# Patient Record
Sex: Female | Born: 1965 | Race: White | Hispanic: No | Marital: Married | Smoking: Never smoker
Health system: Southern US, Community
[De-identification: ages and names within clinical notes are randomized; demographics above are authoritative.]

## PROBLEM LIST (undated history)

## (undated) DIAGNOSIS — I10 Essential (primary) hypertension: Secondary | ICD-10-CM

## (undated) HISTORY — PX: OTHER SURGICAL HISTORY: SHX169

## (undated) HISTORY — DX: Essential (primary) hypertension: I10

---

## 2008-05-03 ENCOUNTER — Emergency Department: Payer: Self-pay | Admitting: Emergency Medicine

## 2008-05-09 ENCOUNTER — Ambulatory Visit: Payer: Self-pay | Admitting: Orthopedic Surgery

## 2008-05-27 ENCOUNTER — Ambulatory Visit: Payer: Self-pay | Admitting: Internal Medicine

## 2009-08-27 ENCOUNTER — Ambulatory Visit: Payer: Self-pay | Admitting: Internal Medicine

## 2009-10-12 ENCOUNTER — Ambulatory Visit: Payer: Self-pay | Admitting: Rheumatology

## 2009-11-09 ENCOUNTER — Ambulatory Visit: Payer: Self-pay | Admitting: Orthopedic Surgery

## 2009-11-13 ENCOUNTER — Ambulatory Visit: Payer: Self-pay | Admitting: Orthopedic Surgery

## 2011-03-04 ENCOUNTER — Ambulatory Visit: Payer: Self-pay

## 2013-03-05 ENCOUNTER — Emergency Department: Payer: Self-pay | Admitting: Emergency Medicine

## 2013-03-05 LAB — CBC WITH DIFFERENTIAL/PLATELET
Basophil %: 1.3 %
Eosinophil #: 0.1 10*3/uL (ref 0.0–0.7)
Eosinophil %: 1.4 %
HCT: 36.2 % (ref 35.0–47.0)
HGB: 12.1 g/dL (ref 12.0–16.0)
MCV: 81 fL (ref 80–100)
Neutrophil #: 3.1 10*3/uL (ref 1.4–6.5)
Platelet: 301 10*3/uL (ref 150–440)
RBC: 4.47 10*6/uL (ref 3.80–5.20)
RDW: 15.1 % — ABNORMAL HIGH (ref 11.5–14.5)
WBC: 5.6 10*3/uL (ref 3.6–11.0)

## 2013-03-05 LAB — URINALYSIS, COMPLETE
Glucose,UR: NEGATIVE mg/dL (ref 0–75)
Leukocyte Esterase: NEGATIVE
Nitrite: NEGATIVE
Ph: 7 (ref 4.5–8.0)
Protein: NEGATIVE
Specific Gravity: 1.014 (ref 1.003–1.030)
Squamous Epithelial: 2

## 2013-03-05 LAB — COMPREHENSIVE METABOLIC PANEL
Albumin: 3.6 g/dL (ref 3.4–5.0)
Alkaline Phosphatase: 109 U/L (ref 50–136)
BUN: 8 mg/dL (ref 7–18)
Calcium, Total: 8.5 mg/dL (ref 8.5–10.1)
Co2: 28 mmol/L (ref 21–32)
Creatinine: 0.77 mg/dL (ref 0.60–1.30)
EGFR (African American): >60
Potassium: 4 mmol/L (ref 3.5–5.1)
SGOT(AST): 16 U/L (ref 15–37)
Sodium: 142 mmol/L (ref 136–145)

## 2017-02-09 ENCOUNTER — Emergency Department: Payer: Self-pay

## 2017-02-09 ENCOUNTER — Emergency Department
Admission: EM | Admit: 2017-02-09 | Discharge: 2017-02-10 | Disposition: A | Discharge status: Home / Self Care | Payer: Self-pay | Attending: Emergency Medicine | Admitting: Emergency Medicine

## 2017-02-09 ENCOUNTER — Encounter: Payer: Self-pay | Admitting: *Deleted

## 2017-02-09 DIAGNOSIS — L03116 Cellulitis of left lower limb: Secondary | ICD-10-CM | POA: Insufficient documentation

## 2017-02-09 MED ORDER — SULFAMETHOXAZOLE-TRIMETHOPRIM 800-160 MG PO TABS: 1.0000 | ORAL_TABLET | Freq: Two times a day (BID) | ORAL | 0 refills | Status: AC

## 2017-02-09 MED ORDER — SULFAMETHOXAZOLE-TRIMETHOPRIM 800-160 MG PO TABS
2.0000 | ORAL_TABLET | Freq: Once | ORAL | Status: DC
  Filled 2017-02-09: qty 2

## 2017-02-09 NOTE — ED Provider Notes (Signed)
[facility] Emergency Department Provider Note   ____________________________________________   First MD Initiated Contact with Patient 02/09/17 2306     (approximate)  I have reviewed the triage vital signs and the nursing notes.   HISTORY  Chief Complaint Leg Swelling    HPI Annette Mcclure is a 51 y.o. female who comes into the hospital today with some left foot swelling. She reports that she traveled from [place] on Sunday. She had swelling in both of her feet but Monday morning when she woke up the swelling was better. She worked on her feet all day and the swelling returned. Yesterday the patient went to urgent care and was given Bactrim as she does have a blister behind her left ankle. The patient reports that the person she saw told her they were concerned about her having a blood clot. The patient did not receive an ultrasound but was told to take aspirin twice a day. She went back to work today. This morning her ankle again was only mildly swollen but it was worse after standing on it all day. The patient reports that standing on her foot her pain is 8 out of 10 in intensity but when she is laying she has no pain. The patient is taking one pill of Bactrim twice a day but her ankle is hot and red. She can barely move it because it is so swollen.   History reviewed. No pertinent past medical history.  There are no active problems to display for this patient.   Past Surgical History:  Procedure Laterality Date  . knee surgery      Prior to Admission medications   Medication Sig Start Date End Date Taking? Authorizing Provider  sulfamethoxazole-trimethoprim (BACTRIM DS,SEPTRA DS) 800-160 MG tablet Take 1 tablet by mouth 2 (two) times daily. 02/09/17 02/19/17  Rebecka Apley, MD    Allergies Patient has no known allergies.  No family history on file.  Social History Social History  Substance Use Topics  . Smoking status: Never Smoker  .  Smokeless tobacco: Never Used  . Alcohol use No    Review of Systems  Constitutional: No fever/chills Eyes: No visual changes. ENT: No sore throat. Cardiovascular: Denies chest pain. Respiratory: Denies shortness of breath. Gastrointestinal: No abdominal pain.  No nausea, no vomiting.  No diarrhea.  No constipation. Genitourinary: Negative for dysuria. Musculoskeletal: Swelling to left ankle Skin: Redness and warmth to left ankle, abrasion to posterior ankle Neurological: Negative for headaches, focal weakness or numbness.   ____________________________________________   PHYSICAL EXAM:  VITAL SIGNS: ED Triage Vitals  Enc Vitals Group     BP 02/09/17 2211 128/69     Pulse Rate 02/09/17 2211 95     Resp 02/09/17 2211 20     Temp 02/09/17 2211 98 F (36.7 C)     Temp Source 02/09/17 2211 Oral     SpO2 02/09/17 2211 99 %     Weight 02/09/17 2211 210 lb (95.3 kg)     Height 02/09/17 2211 5\' 11"  (1.803 m)     Head Circumference --      Peak Flow --      Pain Score 02/09/17 2210 8     Pain Loc --      Pain Edu? --      Excl. in GC? --     Constitutional: Alert and oriented. Well appearing and in Mild distress. Eyes: Conjunctivae are normal. PERRL. EOMI. Head: Atraumatic. Nose: No congestion/rhinnorhea. Mouth/Throat:  Mucous membranes are moist.  Oropharynx non-erythematous. Cardiovascular: Normal rate, regular rhythm. Grossly normal heart sounds.  Good peripheral circulation. Respiratory: Normal respiratory effort.  No retractions. Lungs CTAB. Gastrointestinal: Soft and nontender. No distention. Positive bowel sounds Musculoskeletal: Soft tissue swelling to left ankle with some pain with passive range of motion.  Neurologic:  Normal speech and language. Skin:  Erythema to left foot and ankle with abrasion to posterior ankle Psychiatric: Mood and affect are normal.   ____________________________________________   LABS (all labs ordered are listed, but only abnormal  results are displayed)  Labs Reviewed - No data to display ____________________________________________  EKG  none ____________________________________________  RADIOLOGY  [place] Venous Img Lower Unilateral Left  Result Date: 02/09/2017 CLINICAL DATA:  Initial evaluation for acute leg swelling. EXAM: Left LOWER EXTREMITY VENOUS DOPPLER ULTRASOUND TECHNIQUE: Gray-scale sonography with graded compression, as well as color Doppler and duplex ultrasound were performed to evaluate the lower extremity deep venous systems from the level of the common femoral vein and including the common femoral, femoral, profunda femoral, popliteal and calf veins including the posterior tibial, peroneal and gastrocnemius veins when visible. The superficial great saphenous vein was also interrogated. Spectral Doppler was utilized to evaluate flow at rest and with distal augmentation maneuvers in the common femoral, femoral and popliteal veins. COMPARISON:  None. FINDINGS: Contralateral Common Femoral Vein: Respiratory phasicity is normal and symmetric with the symptomatic side. No evidence of thrombus. Normal compressibility. Common Femoral Vein: No evidence of thrombus. Normal compressibility, respiratory phasicity and response to augmentation. Saphenofemoral Junction: No evidence of thrombus. Normal compressibility and flow on color Doppler imaging. Profunda Femoral Vein: No evidence of thrombus. Normal compressibility and flow on color Doppler imaging. Femoral Vein: No evidence of thrombus. Normal compressibility, respiratory phasicity and response to augmentation. Popliteal Vein: No evidence of thrombus. Normal compressibility, respiratory phasicity and response to augmentation. Calf Veins: No evidence of thrombus. Normal compressibility and flow on color Doppler imaging. Superficial Great Saphenous Vein: No evidence of thrombus. Normal compressibility and flow on color Doppler imaging. Venous Reflux:  None. Other Findings:   None. IMPRESSION: No evidence of DVT within the left lower extremity. Electronically Signed   By: Rise MuBenjamin  McClintock M.D.   On: 02/09/2017 23:02    ____________________________________________   PROCEDURES  Procedure(s) performed: None  Procedures  Critical Care performed: No  ____________________________________________   INITIAL IMPRESSION / ASSESSMENT AND PLAN / ED COURSE  Pertinent labs & imaging results that were available during my care of the patient were reviewed by me and considered in my medical decision making (see chart for details).  This is a 51 year old female who comes into the hospital today with some redness swelling and warmth to her left ankle. The patient did receive an ultrasound but it is negative for DVT. It appears that the patient does have cellulitis. She is taking Bactrim but she is being underdosed. I will give the patient to double strength Bactrim and I will discharge her with a prescription to double her dose of Bactrim at home. The patient should follow-up with her primary care physician.  Clinical Course as of Feb 10 2328  Thu Feb 09, 2017  2305 No evidence of DVT within the left lower extremity. US Venous Img Lower Unilateral Left [AW]    Clinical Course User Index [AW] Rebecka ApleyWebster, Allison P, MD     ____________________________________________   FINAL CLINICAL IMPRESSION(S) / ED DIAGNOSES  Final diagnoses:  Cellulitis of left lower extremity      NEW  MEDICATIONS STARTED DURING THIS VISIT:  New Prescriptions   SULFAMETHOXAZOLE-TRIMETHOPRIM (BACTRIM DS,SEPTRA DS) 800-160 MG TABLET    Take 1 tablet by mouth 2 (two) times daily.     Note:  This document was prepared using Dragon voice recognition software and may include unintentional dictation errors.    Rebecka Apley, MD 02/09/17 2329

## 2017-02-09 NOTE — Discharge Instructions (Signed)
Please take your Bactrim 2 pills twice a day for 10 days. Your prescription is written to double up on your previous dose. Please follow-up with your primary care physician. Please return if he is still having some redness and swelling and pain within the next 3-4 days. Please return with any fevers or vomiting.

## 2017-02-09 NOTE — ED Triage Notes (Signed)
Pt has swelling to left lower leg.  Pt had a blister on back of left foot and now foot is swollen and red.  Pt recently drove 11 hours.  Pt reports pain with ambulating.

## 2017-02-10 MED ORDER — SULFAMETHOXAZOLE-TRIMETHOPRIM 800-160 MG PO TABS
1.0000 | ORAL_TABLET | Freq: Once | ORAL | Status: AC
  Administered 2017-02-10: 1 via ORAL

## 2017-02-10 NOTE — ED Notes (Signed)

## 2017-10-12 ENCOUNTER — Other Ambulatory Visit: Payer: Self-pay

## 2017-10-12 ENCOUNTER — Emergency Department
Admission: EM | Admit: 2017-10-12 | Discharge: 2017-10-12 | Disposition: A | Discharge status: Home / Self Care | Attending: Emergency Medicine | Admitting: Emergency Medicine

## 2017-10-12 ENCOUNTER — Emergency Department

## 2017-10-12 ENCOUNTER — Encounter: Payer: Self-pay | Admitting: Emergency Medicine

## 2017-10-12 DIAGNOSIS — E860 Dehydration: Secondary | ICD-10-CM | POA: Insufficient documentation

## 2017-10-12 DIAGNOSIS — Z3202 Encounter for pregnancy test, result negative: Secondary | ICD-10-CM | POA: Insufficient documentation

## 2017-10-12 DIAGNOSIS — R531 Weakness: Secondary | ICD-10-CM | POA: Diagnosis not present

## 2017-10-12 DIAGNOSIS — R55 Syncope and collapse: Secondary | ICD-10-CM | POA: Diagnosis present

## 2017-10-12 DIAGNOSIS — R0602 Shortness of breath: Secondary | ICD-10-CM | POA: Diagnosis not present

## 2017-10-12 LAB — BASIC METABOLIC PANEL
ANION GAP: 9 (ref 5–15)
BUN: 12 mg/dL (ref 6–20)
CO2: 25 mmol/L (ref 22–32)
Calcium: 8.8 mg/dL — ABNORMAL LOW (ref 8.9–10.3)
Chloride: 103 mmol/L (ref 101–111)
Creatinine, Ser: 0.83 mg/dL (ref 0.44–1.00)
GFR calc non Af Amer: >60 mL/min (ref >60)
GLUCOSE: 105 mg/dL — AB (ref 65–99)
POTASSIUM: 3.6 mmol/L (ref 3.5–5.1)
Sodium: 137 mmol/L (ref 135–145)

## 2017-10-12 LAB — CBC
HEMATOCRIT: 29.3 % — AB (ref 35.0–47.0)
HEMOGLOBIN: 8.9 g/dL — AB (ref 12.0–16.0)
MCH: 20 pg — AB (ref 26.0–34.0)
MCHC: 30.4 g/dL — AB (ref 32.0–36.0)
MCV: 65.9 fL — ABNORMAL LOW (ref 80.0–100.0)
Platelets: 410 10*3/uL (ref 150–440)
RBC: 4.45 MIL/uL (ref 3.80–5.20)
RDW: 18.8 % — ABNORMAL HIGH (ref 11.5–14.5)
WBC: 5.4 10*3/uL (ref 3.6–11.0)

## 2017-10-12 LAB — URINALYSIS, COMPLETE (UACMP) WITH MICROSCOPIC
Bilirubin Urine: NEGATIVE
GLUCOSE, UA: NEGATIVE mg/dL
KETONES UR: NEGATIVE mg/dL
Leukocytes, UA: NEGATIVE
Nitrite: NEGATIVE
PROTEIN: NEGATIVE mg/dL
Specific Gravity, Urine: 1.025 (ref 1.005–1.030)
pH: 7 (ref 5.0–8.0)

## 2017-10-12 LAB — POCT PREGNANCY, URINE: PREG TEST UR: NEGATIVE

## 2017-10-12 LAB — TROPONIN I: Troponin I: <0.03 ng/mL (ref <0.03)

## 2017-10-12 LAB — FIBRIN DERIVATIVES D-DIMER (ARMC ONLY): FIBRIN DERIVATIVES D-DIMER (ARMC): 542.39 ng{FEU}/mL — AB (ref 0.00–499.00)

## 2017-10-12 MED ORDER — SODIUM CHLORIDE 0.9 % IV BOLUS (SEPSIS)
1000.0000 mL | Freq: Once | INTRAVENOUS | Status: AC
  Administered 2017-10-12: 1000 mL via INTRAVENOUS

## 2017-10-12 MED ORDER — IOPAMIDOL (ISOVUE-370) INJECTION 76%
75.0000 mL | Freq: Once | INTRAVENOUS | Status: AC | PRN
  Administered 2017-10-12: 75 mL via INTRAVENOUS
  Filled 2017-10-12: qty 75

## 2017-10-12 NOTE — ED Provider Notes (Addendum)
[facility] Emergency Department Provider Note  ____________________________________________   I have reviewed the triage vital signs and the nursing notes. Where available I have reviewed prior notes and, if possible and indicated, outside hospital notes.    HISTORY  Chief Complaint Near Syncope    HPI Annette Mcclure is a 52 y.o. 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  She is no longer on blood thinners.  She has been in her normal state of health until earlier this week when she had a vomiting illness.  No hematemesis no melena no bright red blood per rectum.  She threw up several times and has been feeling a little bit depleted since then.  Positive sick contacts with similar positive body aches etc.  No fevers.  She is now able to tolerate p.o. with no difficulty but she still feels somewhat dehydrated and today, she felt that her heart was racing and she felt lightheaded.  She did not pass out she denies of chest pain.  She states she has had a few episodes of sensation of her heart going rapidly.  Especially when she stands up.  She denies any recent travel, pain or leg swelling and she feels that she does not have any ongoing blood clot symptoms. At this time she has no symptoms at all. History reviewed. No pertinent past medical history.  There are no active problems to display for this patient.   Past Surgical History:  Procedure Laterality Date  . knee surgery      Prior to Admission medications   Not on File    Allergies Patient has no known allergies.  History reviewed. No pertinent family history.  Social History Social History   Tobacco Use  . Smoking status: Never Smoker  . Smokeless tobacco: Never Used  Substance Use Topics  . Alcohol use: No  . Drug use: No    Review of  Systems Constitutional: No fever/chills Eyes: No visual changes. ENT: No sore throat. No stiff neck no neck pain Cardiovascular: Denies chest pain. Respiratory: Denies shortness of breath. Gastrointestinal:   no vomiting.  No diarrhea.  No constipation. Genitourinary: Negative for dysuria. Musculoskeletal: Negative lower extremity swelling Skin: Negative for rash. Neurological: Negative for severe headaches, focal weakness or numbness.   ____________________________________________   PHYSICAL EXAM:  VITAL SIGNS: ED Triage Vitals  Enc Vitals Group     BP 10/12/17 1434 (!) 153/77     Pulse Rate 10/12/17 1434 83     Resp 10/12/17 1434 18     Temp 10/12/17 1434 98.3 F (36.8 C)     Temp Source 10/12/17 1434 Oral     SpO2 10/12/17 1434 99 %     Weight 10/12/17 1435 209 lb (94.8 kg)     Height 10/12/17 1435 5\' 11"  (1.803 m)     Head Circumference --      Peak Flow --      Pain Score --      Pain Loc --      Pain Edu? --      Excl. in GC? --     Constitutional: Alert and oriented. Well appearing and in no acute distress.  Patient is anxious Eyes: Conjunctivae are normal Head: Atraumatic HEENT: No congestion/rhinnorhea. Mucous membranes are dry.  Oropharynx non-erythematous Neck:   Nontender with no meningismus, no masses, no  stridor Cardiovascular: Normal rate, regular rhythm. Grossly normal heart sounds.  Good peripheral circulation. Respiratory: Normal respiratory effort.  No retractions. Lungs CTAB. Abdominal: Soft and nontender. No distention. No guarding no rebound Back:  There is no focal tenderness or step off.  there is no midline tenderness there are no lesions noted. there is no CVA tenderness Musculoskeletal: No lower extremity tenderness, no upper extremity tenderness. No joint effusions, no DVT signs strong distal pulses no edema Neurologic:  Normal speech and language. No gross focal neurologic deficits are appreciated.  Skin:  Skin is warm, dry and intact. No  rash noted. Psychiatric: Mood and affect are anxious. Speech and behavior are normal.  ____________________________________________   LABS (all labs ordered are listed, but only abnormal results are displayed)  Labs Reviewed  BASIC METABOLIC PANEL - Abnormal; Notable for the following components:      Result Value   Glucose, Bld 105 (*)    Calcium 8.8 (*)    All other components within normal limits  CBC - Abnormal; Notable for the following components:   Hemoglobin 8.9 (*)    HCT 29.3 (*)    MCV 65.9 (*)    MCH 20.0 (*)    MCHC 30.4 (*)    RDW 18.8 (*)    All other components within normal limits  URINALYSIS, COMPLETE (UACMP) WITH MICROSCOPIC  FIBRIN DERIVATIVES D-DIMER ([facility] ONLY)  TROPONIN I  CBG MONITORING, ED  POC URINE PREG, ED    Pertinent labs  results that were available during my care of the patient were reviewed by me and considered in my medical decision making (see chart for details). ____________________________________________  EKG  I personally interpreted any EKGs ordered by me or triage Sinus rhythm rate 86 bpm no acute ST elevation no acute ST depression normal axis no acute ischemic changes unremarkable EKG ____________________________________________  RADIOLOGY  Pertinent labs & imaging results that were available during my care of the patient were reviewed by me and considered in my medical decision making (see chart for details). If possible, patient and/or family made aware of any abnormal findings.  No results found. ____________________________________________    PROCEDURES  Procedure(s) performed: None  Procedures  Critical Care performed: None  ____________________________________________   INITIAL IMPRESSION / ASSESSMENT AND PLAN / ED COURSE  Pertinent labs & imaging results that were available during my care of the patient were reviewed by me and considered in my medical decision making (see chart for details).  She is here  with palpitations and lightheadedness, unfortunately she did recently have a superficial thrombosis of the left saphenous vein.  Given that sometimes PEs can present in a typical manner such as this will obtain a CT scan of her chest as a precaution.  However, most of her symptoms are quite reassuring.  She did have a episode that her heart was pounding was in the room but her heart rate stayed in the 80s and normal sinus in appearance.  No evidence therefore of dysrhythmia.  We will give her IV fluid, and we will monitor her closely here in the emergency department.  I am reassured by her findings thus far and I hope that we can get her safely home after further evaluation  ----------------------------------------- 9:22 PM on 10/12/2017 -----------------------------------------  Here with a sensation of lightheadedness is been coming and going, on a monitor we see no ectopy, and serial enzymes are negative CT of the chest is normal blood work is normal she has trace hematuria in  her urine but she states she has had some vaginal spotting, she has no abdominal pain her exam is completely normal head to toe, at this time.  She is anemic but this is chronic for her and she is exactly at her baseline.  Certainly could be more symptomatically there anemia after her vomiting illness earlier this week however at this time there is no evidence of acute bleeding of any significance.  Patient and I had a long discussion about her findings she is very reassured she feels 100% better after IV fluid and she would like to go home.  Return precautions and follow-up given and understood.  She will see her doctor tomorrow.  I have advised that if she has recurrent symptoms she is to return to the emergency room and she should follow closely with cardiology for Holter monitor and primary care.    ____________________________________________   FINAL CLINICAL IMPRESSION(S) / ED DIAGNOSES  Final diagnoses:  None       This chart was dictated using voice recognition software.  Despite best efforts to proofread,  errors can occur which can change meaning.      Jeanmarie PlantMcShane, Theresa Dohrman A, MD 10/12/17 1914    Jeanmarie PlantMcShane, Omarius Grantham A, MD 10/12/17 2123

## 2017-10-12 NOTE — ED Triage Notes (Signed)
Pt reports that she feels that is going to pass out. She report that she is suppose to get iron infusion tomorrow. She also reports she had diarrhea early this week also.

## 2017-10-12 NOTE — ED Notes (Signed)
Pt returned from CT °

## 2017-10-12 NOTE — Discharge Instructions (Signed)
We are very reassured by her workup here, if you have any new or worrisome symptoms including chest pain shortness of breath you feel like you are going to pass out, vomiting, bleeding, or anything else of concern please return to the emergency room.  Please follow closely with primary care and referral cardiology for possible Holter monitor.  Drink plenty of fluids, continue with the iron infusions as your doctor recommends and return if you feel worse in any significant way

## 2019-11-01 IMAGING — CT CT ANGIO CHEST
2 of 6 series · 18 of 46 positions shown · IV contrast (APPLIED)
Comparison: None.

CLINICAL DATA: Shortness of breath and near syncope.

EXAM:
CT ANGIOGRAPHY CHEST WITH CONTRAST
TECHNIQUE: Multidetector CT imaging of the chest was performed using the
standard protocol during bolus administration of intravenous
contrast. Multiplanar CT image reconstructions and MIPs were
obtained to evaluate the vascular anatomy.
CONTRAST:  75mL 6ZPS8Y-EF1 IOPAMIDOL (6ZPS8Y-EF1) INJECTION 76%

[Series 5: thins · axial · 0.66mm/px · z∈[-340,-97]mm · 15 of 267 slices shown]
[im 12/267  lung]
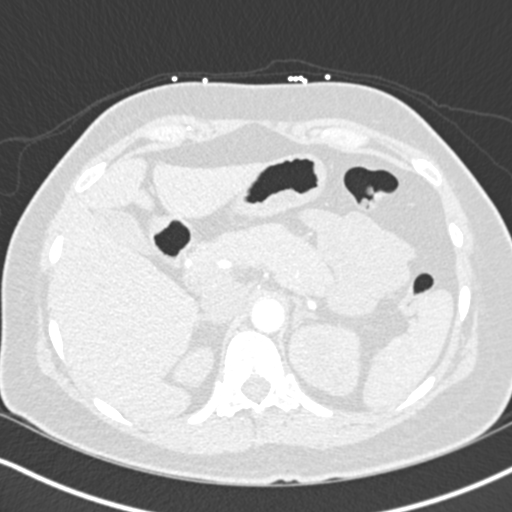
[im 35/267  soft-tissue]
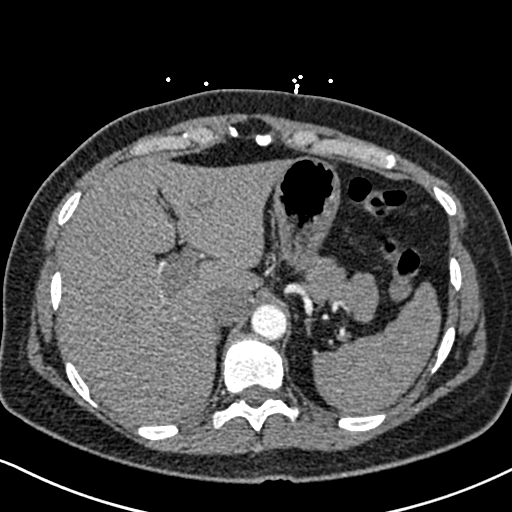
[im 47/267  lung]
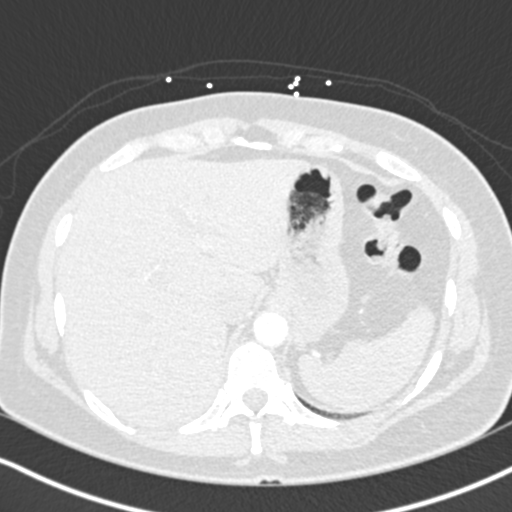
[im 70/267  soft-tissue]
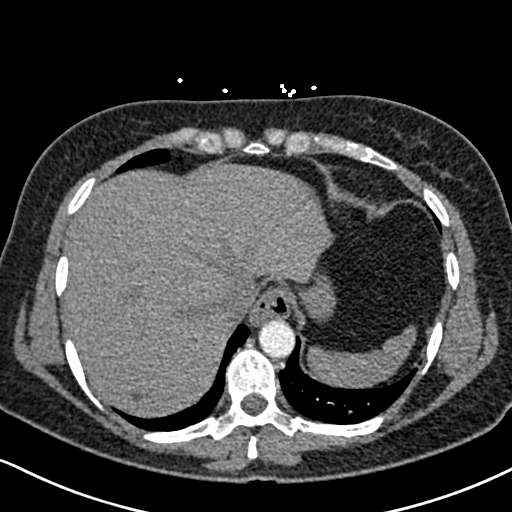
[im 81/267  lung]
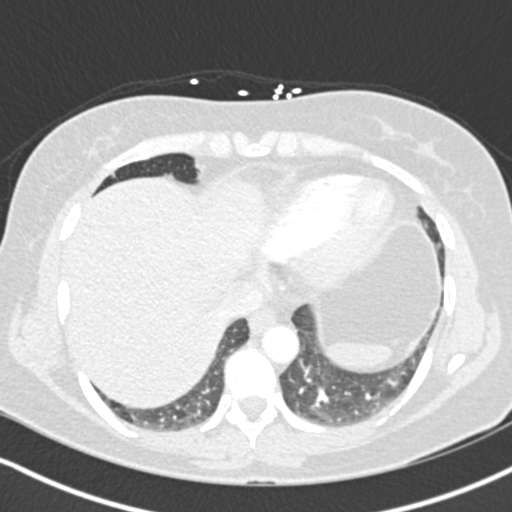
[im 105/267  soft-tissue]
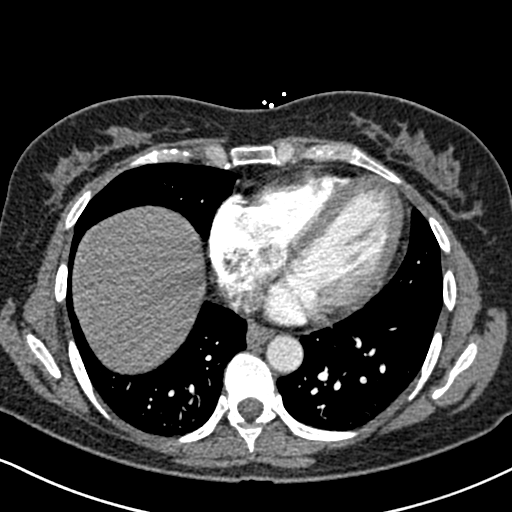
[im 116/267  lung]
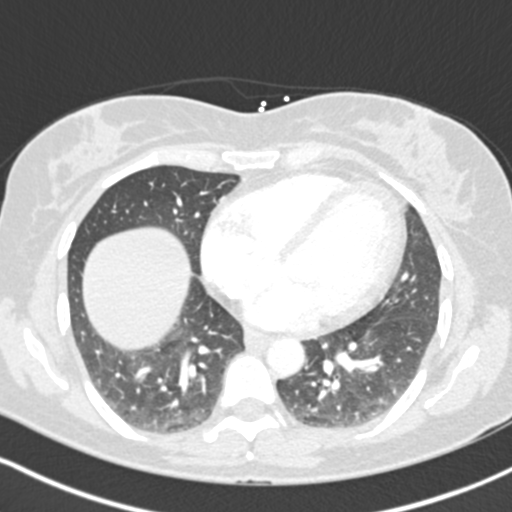
[im 139/267  soft-tissue]
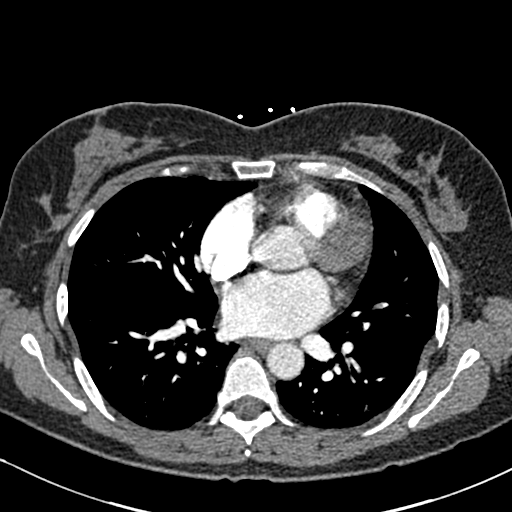
[im 151/267  lung]
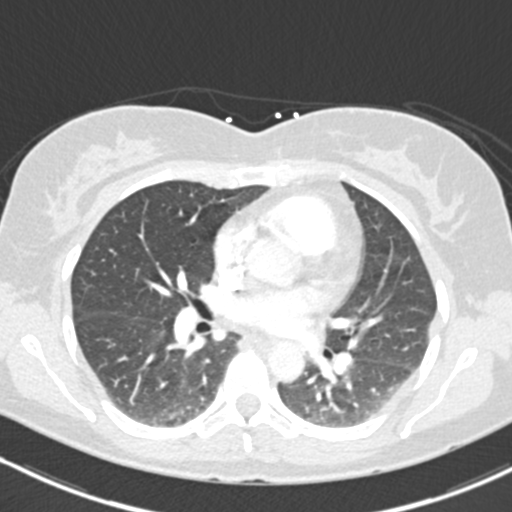
[im 162/267  soft-tissue]
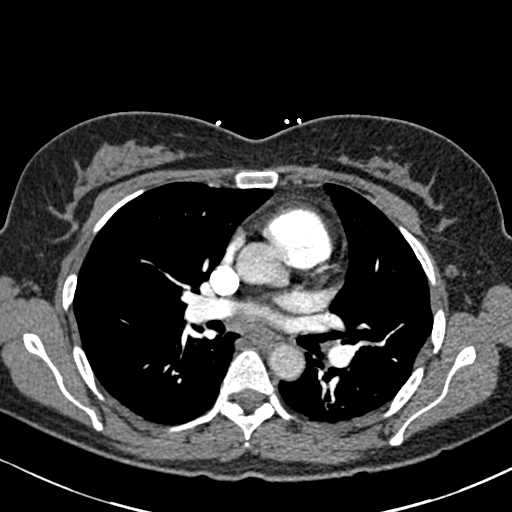
[im 186/267  lung]
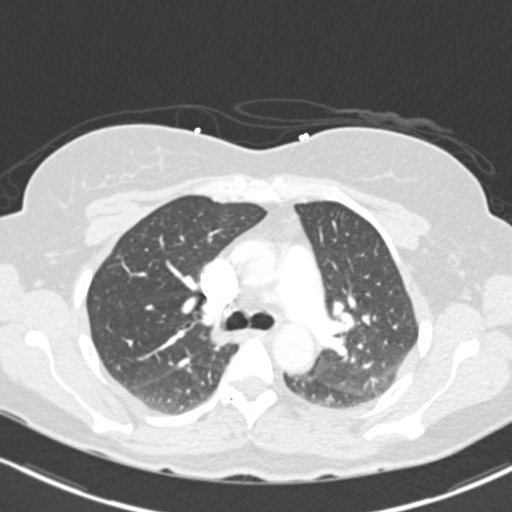
[im 197/267  soft-tissue]
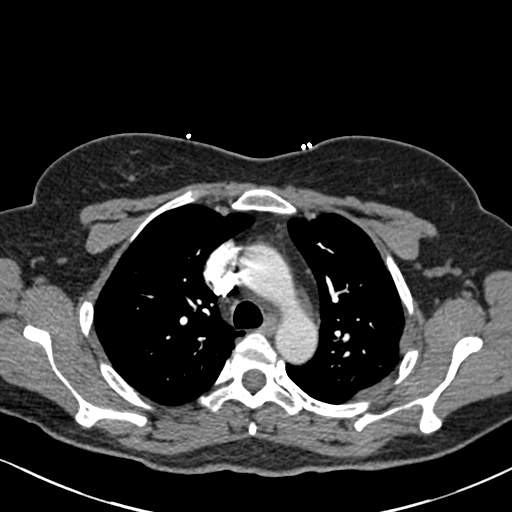
[im 220/267  lung]
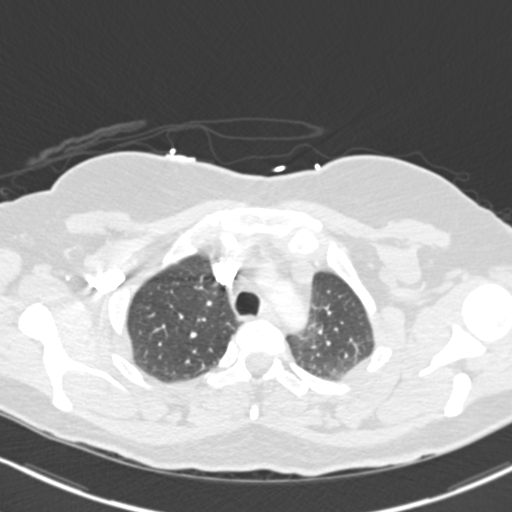
[im 232/267  soft-tissue]
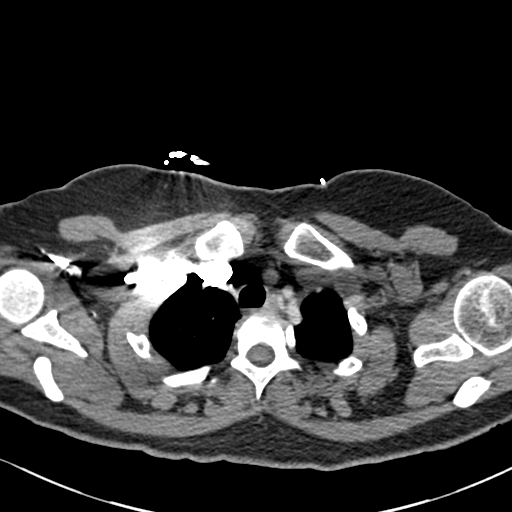
[im 255/267  lung]
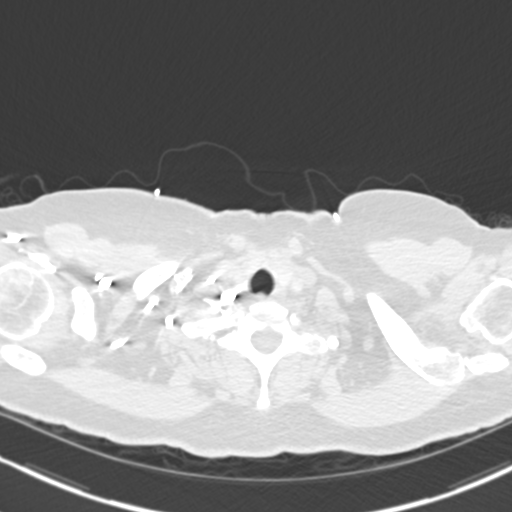

[Series 7: coronal mpr · coronal · 0.58mm/px · 3 of 83 slices shown]
[im 21/83  soft-tissue]
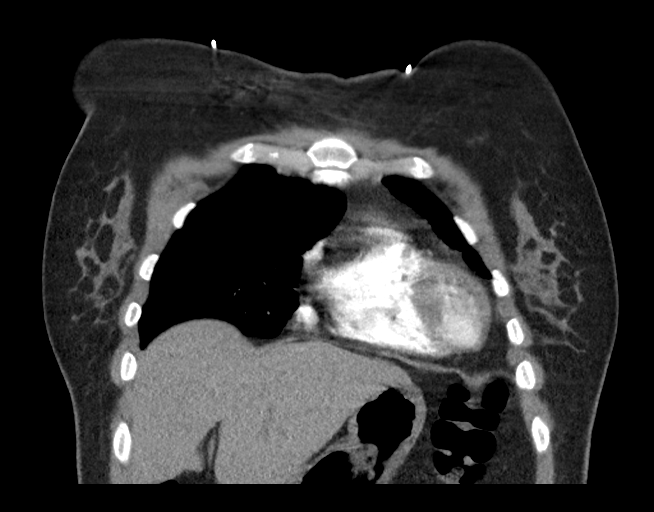
[im 42/83  soft-tissue]
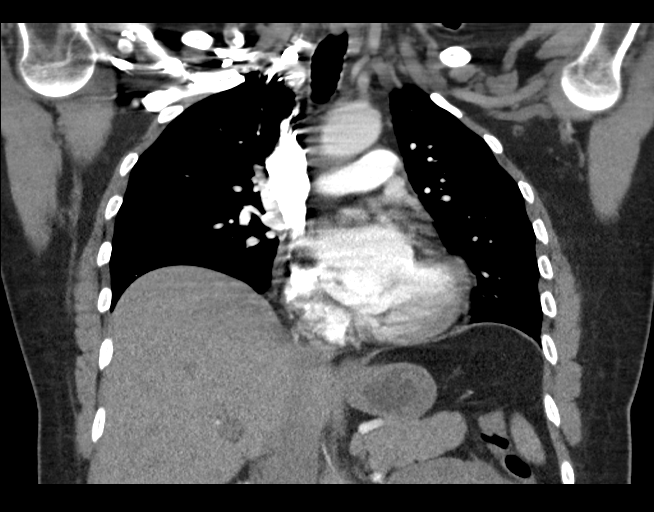
[im 62/83  soft-tissue]
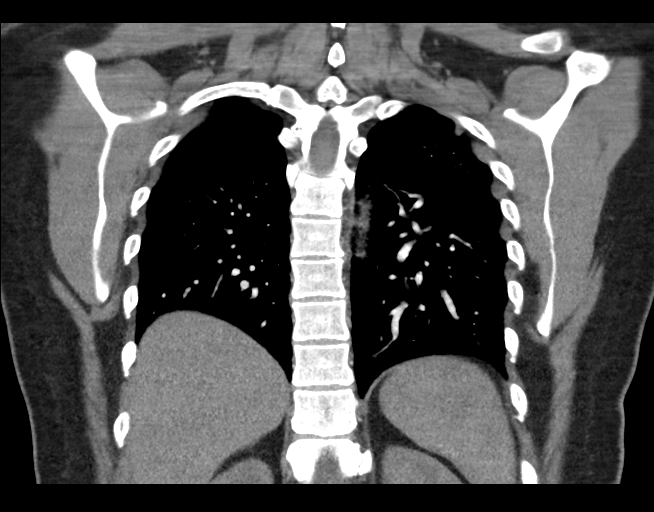

[18 of 46 positions shown; findings below may reference images not displayed]

FINDINGS: Cardiovascular: The heart is normal in size. No pericardial
effusion. The aorta is normal in caliber. No dissection. No definite
coronary artery calcifications.

The pulmonary arterial tree is fairly well opacified. No filling
defects to suggest pulmonary embolism.

Mediastinum/Nodes: Scattered small mediastinal and hilar lymph nodes
but no mass or adenopathy. The esophagus is grossly normal. Small
hiatal hernia.

Lungs/Pleura: No acute pulmonary findings. Dependent subpleural
atelectasis. No pleural effusion. No worrisome pulmonary lesions.

Upper Abdomen: Tiny low-attenuation lesion in the right hepatic lobe
posteriorly on image number 67 is most likely a benign cyst. No
worrisome hepatic lesions. The gallbladder is grossly normal. No
adrenal gland lesions. The visualized pancreas is unremarkable. The
upper abdominal aorta is unremarkable.

Musculoskeletal: No breast masses, supraclavicular or axillary
lymphadenopathy. Small scattered lymph nodes are noted. The thyroid
gland appears normal.

The bony structures are unremarkable. No acute bony findings or
worrisome bone lesion.

Review of the MIP images confirms the above findings.
IMPRESSION: 1. No CT findings for pulmonary embolism.
2. Normal thoracic aorta.
3. No acute pulmonary findings.
4. Small hiatal hernia.
5. No significant upper abdominal findings.

## 2023-05-15 ENCOUNTER — Emergency Department
Admission: EM | Admit: 2023-05-15 | Discharge: 2023-05-15 | Disposition: A | Discharge status: Home / Self Care | Attending: Emergency Medicine | Admitting: Emergency Medicine

## 2023-05-15 ENCOUNTER — Other Ambulatory Visit: Payer: Self-pay

## 2023-05-15 DIAGNOSIS — R55 Syncope and collapse: Secondary | ICD-10-CM | POA: Diagnosis present

## 2023-05-15 LAB — URINALYSIS, ROUTINE W REFLEX MICROSCOPIC
Bilirubin Urine: NEGATIVE
Glucose, UA: NEGATIVE mg/dL
Ketones, ur: NEGATIVE mg/dL
Leukocytes,Ua: NEGATIVE
Nitrite: NEGATIVE
Protein, ur: NEGATIVE mg/dL
Specific Gravity, Urine: 1.006 (ref 1.005–1.030)
pH: 7 (ref 5.0–8.0)

## 2023-05-15 LAB — CBC
HCT: 40.9 % (ref 36.0–46.0)
Hemoglobin: 14 g/dL (ref 12.0–15.0)
MCH: 30.6 pg (ref 26.0–34.0)
MCHC: 34.2 g/dL (ref 30.0–36.0)
MCV: 89.3 fL (ref 80.0–100.0)
Platelets: 241 10*3/uL (ref 150–400)
RBC: 4.58 MIL/uL (ref 3.87–5.11)
RDW: 12.5 % (ref 11.5–15.5)
WBC: 6.6 10*3/uL (ref 4.0–10.5)
nRBC: 0 % (ref 0.0–0.2)

## 2023-05-15 LAB — BASIC METABOLIC PANEL
Anion gap: 8 (ref 5–15)
BUN: 12 mg/dL (ref 6–20)
CO2: 24 mmol/L (ref 22–32)
Calcium: 9.2 mg/dL (ref 8.9–10.3)
Chloride: 106 mmol/L (ref 98–111)
Creatinine, Ser: 0.78 mg/dL (ref 0.44–1.00)
GFR, Estimated: >60 mL/min (ref >60)
Glucose, Bld: 114 mg/dL — ABNORMAL HIGH (ref 70–99)
Potassium: 3.4 mmol/L — ABNORMAL LOW (ref 3.5–5.1)
Sodium: 138 mmol/L (ref 135–145)

## 2023-05-15 MED ORDER — ONDANSETRON 4 MG PO TBDP
4.0000 mg | ORAL_TABLET | Freq: Once | ORAL | Status: AC
  Administered 2023-05-15: 4 mg via ORAL
  Filled 2023-05-15: qty 1

## 2023-05-15 NOTE — ED Triage Notes (Signed)
Pt presents to the ED via [facility] from home for near syncopal episode. Pt states that when this happened she started having palpitations. Pt reports hx of these episodes. Denies CP or SHOB. Denies palpitations at this time. Pt A&Ox4 at time of triage.

## 2023-05-15 NOTE — ED Triage Notes (Signed)
First Nurse Note: Patient to ED via [facility] from home for dizziness. HR 90 and BP 160/100.

## 2023-05-15 NOTE — ED Provider Notes (Signed)
[facility] Provider Note    Event Date/Time   First MD Initiated Contact with Patient 05/15/23 1137     (approximate)   History   Near Syncope   HPI  Annette Mcclure is a 57 y.o. female who present during near syncopal episode.  Patient reports she had a flushed sensation while sitting on the couch and felt like she was going to pass out.  She is feeling improved now.  She reports this has been occurring over the last several months intermittently.  She reports she did feel that her heart rate was elevated this time but does not typically feel that way.  No chest pain.  No shortness of breath.  No medications     Physical Exam   Triage Vital Signs: ED Triage Vitals  Encounter Vitals Group     BP 05/15/23 1111 (!) 142/85     Systolic BP Percentile --      Diastolic BP Percentile --      Pulse Rate 05/15/23 1111 74     Resp 05/15/23 1111 18     Temp 05/15/23 1111 98.2 F (36.8 C)     Temp Source 05/15/23 1111 Oral     SpO2 05/15/23 1111 98 %     Weight 05/15/23 1113 97.1 kg (214 lb)     Height 05/15/23 1113 1.829 m (6')     Head Circumference --      Peak Flow --      Pain Score 05/15/23 1112 0     Pain Loc --      Pain Education --      Exclude from Growth Chart --     Most recent vital signs: Vitals:   05/15/23 1111  BP: (!) 142/85  Pulse: 74  Resp: 18  Temp: 98.2 F (36.8 C)  SpO2: 98%     General: Awake, no distress.  CV:  Good peripheral perfusion.  Regular rate and rhythm Resp:  Normal effort.  Abd:  No distention.  Other:     ED Results / Procedures / Treatments   Labs (all labs ordered are listed, but only abnormal results are displayed) Labs Reviewed  BASIC METABOLIC PANEL - Abnormal; Notable for the following components:      Result Value   Potassium 3.4 (*)    Glucose, Bld 114 (*)    All other components within normal limits  URINALYSIS, ROUTINE W REFLEX MICROSCOPIC - Abnormal; Notable for the following  components:   Color, Urine STRAW (*)    APPearance CLEAR (*)    Hgb urine dipstick SMALL (*)    Bacteria, UA RARE (*)    All other components within normal limits  CBC     EKG  ED ECG REPORT I, Jene Every, the attending physician, personally viewed and interpreted this ECG.  Date: 05/15/2023  Rhythm: normal sinus rhythm QRS Axis: normal Intervals: normal ST/T Wave abnormalities: normal Narrative Interpretation: no evidence of acute ischemia    RADIOLOGY     PROCEDURES:  Critical Care performed:   Procedures   MEDICATIONS ORDERED IN ED: Medications  ondansetron (ZOFRAN-ODT) disintegrating tablet 4 mg (4 mg Oral Given 05/15/23 1214)     IMPRESSION / MDM / ASSESSMENT AND PLAN / ED COURSE  I reviewed the triage vital signs and the nursing notes. Patient's presentation is most consistent with acute presentation with potential threat to life or bodily function.  Patient presents after a near syncopal episode as described above.  Feeling improved now.  Differential includes medication reaction, electrolyte abnormality, arrhythmia, vasovagal episode  Overall she is well-appearing here and in no acute distress.  Lab work is reassuring, EKG is unremarkable.  Discussed with patient the need for outpatient follow-up with cardiology for possible Holter monitor        FINAL CLINICAL IMPRESSION(S) / ED DIAGNOSES   Final diagnoses:  Near syncope     Rx / DC Orders   ED Discharge Orders          Ordered    Ambulatory referral to Cardiology       Comments: If you have not heard from the Cardiology office within the next 72 hours please call [phone].   05/15/23 1220             Note:  This document was prepared using Dragon voice recognition software and may include unintentional dictation errors.   Jene Every, MD 05/15/23 [phone]
